# Patient Record
Sex: Female | Born: 1955 | Race: Asian | Hispanic: No | Marital: Married | Smoking: Never smoker
Health system: Southern US, Community
[De-identification: ages and names within clinical notes are randomized; demographics above are authoritative.]

## PROBLEM LIST (undated history)

## (undated) DIAGNOSIS — E7800 Pure hypercholesterolemia, unspecified: Secondary | ICD-10-CM

## (undated) HISTORY — PX: AUGMENTATION MAMMAPLASTY: SUR837

---

## 2002-10-22 ENCOUNTER — Encounter: Payer: Self-pay | Admitting: Family Medicine

## 2002-10-22 ENCOUNTER — Ambulatory Visit (HOSPITAL_COMMUNITY): Admission: RE | Admit: 2002-10-22 | Discharge: 2002-10-22 | Payer: Self-pay | Admitting: Family Medicine

## 2003-09-22 ENCOUNTER — Other Ambulatory Visit: Admission: RE | Admit: 2003-09-22 | Discharge: 2003-09-22 | Payer: Self-pay | Admitting: Family Medicine

## 2003-10-26 ENCOUNTER — Ambulatory Visit (HOSPITAL_COMMUNITY): Admission: RE | Admit: 2003-10-26 | Discharge: 2003-10-26 | Payer: Self-pay | Admitting: Family Medicine

## 2004-12-06 ENCOUNTER — Ambulatory Visit (HOSPITAL_COMMUNITY): Admission: RE | Admit: 2004-12-06 | Discharge: 2004-12-06 | Payer: Self-pay | Admitting: Family Medicine

## 2005-01-03 ENCOUNTER — Other Ambulatory Visit: Admission: RE | Admit: 2005-01-03 | Discharge: 2005-01-03 | Payer: Self-pay | Admitting: Family Medicine

## 2006-02-26 ENCOUNTER — Ambulatory Visit (HOSPITAL_COMMUNITY): Admission: RE | Admit: 2006-02-26 | Discharge: 2006-02-26 | Payer: Self-pay | Admitting: Family Medicine

## 2007-03-07 ENCOUNTER — Ambulatory Visit (HOSPITAL_COMMUNITY): Admission: RE | Admit: 2007-03-07 | Discharge: 2007-03-07 | Payer: Self-pay | Admitting: Internal Medicine

## 2008-03-10 ENCOUNTER — Ambulatory Visit (HOSPITAL_COMMUNITY): Admission: RE | Admit: 2008-03-10 | Discharge: 2008-03-10 | Payer: Self-pay | Admitting: Internal Medicine

## 2009-03-25 ENCOUNTER — Ambulatory Visit (HOSPITAL_COMMUNITY): Admission: RE | Admit: 2009-03-25 | Discharge: 2009-03-25 | Payer: Self-pay | Admitting: Internal Medicine

## 2010-05-05 ENCOUNTER — Ambulatory Visit (HOSPITAL_COMMUNITY): Admission: RE | Admit: 2010-05-05 | Discharge: 2010-05-05 | Payer: Self-pay | Admitting: Internal Medicine

## 2010-09-18 ENCOUNTER — Encounter: Payer: Self-pay | Admitting: Family Medicine

## 2011-07-03 ENCOUNTER — Other Ambulatory Visit (HOSPITAL_BASED_OUTPATIENT_CLINIC_OR_DEPARTMENT_OTHER): Payer: Self-pay | Admitting: Internal Medicine

## 2011-07-03 ENCOUNTER — Other Ambulatory Visit (HOSPITAL_COMMUNITY): Payer: Self-pay | Admitting: Internal Medicine

## 2011-07-03 DIAGNOSIS — Z1231 Encounter for screening mammogram for malignant neoplasm of breast: Secondary | ICD-10-CM

## 2011-07-04 ENCOUNTER — Ambulatory Visit (HOSPITAL_BASED_OUTPATIENT_CLINIC_OR_DEPARTMENT_OTHER): Payer: Self-pay

## 2011-08-01 ENCOUNTER — Ambulatory Visit (HOSPITAL_COMMUNITY): Payer: Self-pay

## 2011-08-07 ENCOUNTER — Ambulatory Visit (HOSPITAL_BASED_OUTPATIENT_CLINIC_OR_DEPARTMENT_OTHER): Payer: Self-pay

## 2011-08-08 ENCOUNTER — Ambulatory Visit (HOSPITAL_BASED_OUTPATIENT_CLINIC_OR_DEPARTMENT_OTHER)
Admission: RE | Admit: 2011-08-08 | Discharge: 2011-08-08 | Disposition: A | Discharge status: Home / Self Care | Payer: 59 | Source: Ambulatory Visit | Attending: Internal Medicine | Admitting: Internal Medicine

## 2011-08-08 DIAGNOSIS — Z1231 Encounter for screening mammogram for malignant neoplasm of breast: Secondary | ICD-10-CM | POA: Insufficient documentation

## 2012-10-21 ENCOUNTER — Ambulatory Visit (HOSPITAL_BASED_OUTPATIENT_CLINIC_OR_DEPARTMENT_OTHER)
Admission: RE | Admit: 2012-10-21 | Discharge: 2012-10-21 | Disposition: A | Discharge status: Home / Self Care | Payer: 59 | Source: Ambulatory Visit | Attending: Internal Medicine | Admitting: Internal Medicine

## 2012-10-21 ENCOUNTER — Other Ambulatory Visit (HOSPITAL_BASED_OUTPATIENT_CLINIC_OR_DEPARTMENT_OTHER): Payer: Self-pay | Admitting: *Deleted

## 2012-10-21 ENCOUNTER — Other Ambulatory Visit (HOSPITAL_BASED_OUTPATIENT_CLINIC_OR_DEPARTMENT_OTHER): Payer: Self-pay | Admitting: Internal Medicine

## 2012-10-21 DIAGNOSIS — Z1239 Encounter for other screening for malignant neoplasm of breast: Secondary | ICD-10-CM

## 2012-10-21 DIAGNOSIS — Z1231 Encounter for screening mammogram for malignant neoplasm of breast: Secondary | ICD-10-CM | POA: Insufficient documentation

## 2013-11-06 ENCOUNTER — Other Ambulatory Visit (HOSPITAL_BASED_OUTPATIENT_CLINIC_OR_DEPARTMENT_OTHER): Payer: Self-pay | Admitting: General Practice

## 2013-11-06 ENCOUNTER — Other Ambulatory Visit (HOSPITAL_BASED_OUTPATIENT_CLINIC_OR_DEPARTMENT_OTHER): Payer: Self-pay | Admitting: Cardiology

## 2013-11-06 ENCOUNTER — Other Ambulatory Visit (HOSPITAL_BASED_OUTPATIENT_CLINIC_OR_DEPARTMENT_OTHER): Payer: Self-pay | Admitting: Internal Medicine

## 2013-11-06 DIAGNOSIS — Z1231 Encounter for screening mammogram for malignant neoplasm of breast: Secondary | ICD-10-CM

## 2013-11-12 ENCOUNTER — Ambulatory Visit (HOSPITAL_BASED_OUTPATIENT_CLINIC_OR_DEPARTMENT_OTHER)
Admission: RE | Admit: 2013-11-12 | Discharge: 2013-11-12 | Disposition: A | Discharge status: Home / Self Care | Payer: Managed Care, Other (non HMO) | Source: Ambulatory Visit | Attending: General Practice | Admitting: General Practice

## 2013-11-12 DIAGNOSIS — Z1231 Encounter for screening mammogram for malignant neoplasm of breast: Secondary | ICD-10-CM

## 2015-09-01 ENCOUNTER — Other Ambulatory Visit: Payer: Self-pay

## 2016-10-04 ENCOUNTER — Other Ambulatory Visit: Payer: Self-pay

## 2017-03-17 ENCOUNTER — Other Ambulatory Visit (INDEPENDENT_AMBULATORY_CARE_PROVIDER_SITE_OTHER): Payer: Self-pay | Admitting: Adult Health

## 2017-03-17 ENCOUNTER — Ambulatory Visit (INDEPENDENT_AMBULATORY_CARE_PROVIDER_SITE_OTHER): Payer: Self-pay

## 2017-03-17 DIAGNOSIS — S99921A Unspecified injury of right foot, initial encounter: Secondary | ICD-10-CM

## 2017-04-24 ENCOUNTER — Ambulatory Visit (HOSPITAL_BASED_OUTPATIENT_CLINIC_OR_DEPARTMENT_OTHER)
Admission: RE | Admit: 2017-04-24 | Discharge: 2017-04-24 | Disposition: A | Discharge status: Home / Self Care | Payer: Managed Care, Other (non HMO) | Source: Ambulatory Visit | Attending: Physician Assistant | Admitting: Physician Assistant

## 2017-04-24 ENCOUNTER — Inpatient Hospital Stay (HOSPITAL_BASED_OUTPATIENT_CLINIC_OR_DEPARTMENT_OTHER)
Admission: EM | Admit: 2017-04-24 | Discharge: 2017-04-28 | DRG: 373 | Disposition: A | Discharge status: Home / Self Care | Payer: Managed Care, Other (non HMO) | Attending: General Surgery | Admitting: General Surgery

## 2017-04-24 ENCOUNTER — Other Ambulatory Visit (HOSPITAL_BASED_OUTPATIENT_CLINIC_OR_DEPARTMENT_OTHER): Payer: Self-pay | Admitting: Physician Assistant

## 2017-04-24 ENCOUNTER — Encounter (HOSPITAL_BASED_OUTPATIENT_CLINIC_OR_DEPARTMENT_OTHER): Payer: Self-pay | Admitting: Radiology

## 2017-04-24 DIAGNOSIS — K3532 Acute appendicitis with perforation and localized peritonitis, without abscess: Secondary | ICD-10-CM | POA: Diagnosis present

## 2017-04-24 DIAGNOSIS — R1031 Right lower quadrant pain: Secondary | ICD-10-CM

## 2017-04-24 DIAGNOSIS — R935 Abnormal findings on diagnostic imaging of other abdominal regions, including retroperitoneum: Secondary | ICD-10-CM | POA: Insufficient documentation

## 2017-04-24 DIAGNOSIS — K37 Unspecified appendicitis: Secondary | ICD-10-CM | POA: Insufficient documentation

## 2017-04-24 DIAGNOSIS — Z7983 Long term (current) use of bisphosphonates: Secondary | ICD-10-CM

## 2017-04-24 DIAGNOSIS — M810 Age-related osteoporosis without current pathological fracture: Secondary | ICD-10-CM | POA: Diagnosis present

## 2017-04-24 DIAGNOSIS — E7800 Pure hypercholesterolemia, unspecified: Secondary | ICD-10-CM | POA: Diagnosis present

## 2017-04-24 DIAGNOSIS — R911 Solitary pulmonary nodule: Secondary | ICD-10-CM | POA: Insufficient documentation

## 2017-04-24 DIAGNOSIS — E785 Hyperlipidemia, unspecified: Secondary | ICD-10-CM | POA: Diagnosis present

## 2017-04-24 DIAGNOSIS — Z882 Allergy status to sulfonamides status: Secondary | ICD-10-CM

## 2017-04-24 DIAGNOSIS — K352 Acute appendicitis with generalized peritonitis: Principal | ICD-10-CM | POA: Diagnosis present

## 2017-04-24 HISTORY — DX: Pure hypercholesterolemia, unspecified: E78.00

## 2017-04-24 LAB — CBC WITH DIFFERENTIAL/PLATELET
Basophils Absolute: 0 10*3/uL (ref 0.0–0.1)
Basophils Relative: 1 %
EOS PCT: 3 %
Eosinophils Absolute: 0.1 10*3/uL (ref 0.0–0.7)
HEMATOCRIT: 39.1 % (ref 36.0–46.0)
HEMOGLOBIN: 13.2 g/dL (ref 12.0–15.0)
LYMPHS ABS: 1.1 10*3/uL (ref 0.7–4.0)
LYMPHS PCT: 28 %
MCH: 30 pg (ref 26.0–34.0)
MCHC: 33.8 g/dL (ref 30.0–36.0)
MCV: 88.9 fL (ref 78.0–100.0)
Monocytes Absolute: 0.4 10*3/uL (ref 0.1–1.0)
Monocytes Relative: 9 %
NEUTROS ABS: 2.4 10*3/uL (ref 1.7–7.7)
Neutrophils Relative %: 59 %
PLATELETS: 163 10*3/uL (ref 150–400)
RBC: 4.4 MIL/uL (ref 3.87–5.11)
RDW: 11.9 % (ref 11.5–15.5)
WBC: 4 10*3/uL (ref 4.0–10.5)

## 2017-04-24 LAB — BASIC METABOLIC PANEL
Anion gap: 9 (ref 5–15)
BUN: 10 mg/dL (ref 6–20)
CHLORIDE: 101 mmol/L (ref 101–111)
CO2: 28 mmol/L (ref 22–32)
Calcium: 10.1 mg/dL (ref 8.9–10.3)
Creatinine, Ser: 0.66 mg/dL (ref 0.44–1.00)
GFR calc Af Amer: >60 mL/min (ref >60)
GFR calc non Af Amer: >60 mL/min (ref >60)
GLUCOSE: 94 mg/dL (ref 65–99)
POTASSIUM: 4 mmol/L (ref 3.5–5.1)
Sodium: 138 mmol/L (ref 135–145)

## 2017-04-24 MED ORDER — DIPHENHYDRAMINE HCL 12.5 MG/5ML PO ELIX: 12.5000 mg | ORAL_SOLUTION | Freq: Four times a day (QID) | ORAL | Status: DC | PRN

## 2017-04-24 MED ORDER — PIPERACILLIN-TAZOBACTAM 3.375 G IVPB 30 MIN
3.3750 g | Freq: Once | INTRAVENOUS | Status: AC
  Administered 2017-04-24: 3.375 g via INTRAVENOUS
  Filled 2017-04-24 (×2): qty 50

## 2017-04-24 MED ORDER — ONDANSETRON HCL 4 MG/2ML IJ SOLN: 4.0000 mg | Freq: Four times a day (QID) | INTRAMUSCULAR | Status: DC | PRN

## 2017-04-24 MED ORDER — MORPHINE SULFATE (PF) 2 MG/ML IV SOLN: 2.0000 mg | INTRAVENOUS | Status: DC | PRN

## 2017-04-24 MED ORDER — HYDROMORPHONE HCL-NACL 0.5-0.9 MG/ML-% IV SOSY: 0.5000 mg | PREFILLED_SYRINGE | INTRAVENOUS | Status: DC | PRN

## 2017-04-24 MED ORDER — IOPAMIDOL (ISOVUE-300) INJECTION 61%
100.0000 mL | Freq: Once | INTRAVENOUS | Status: AC | PRN
Start: 2017-04-24 — End: 2017-04-24
  Administered 2017-04-24: 100 mL via INTRAVENOUS

## 2017-04-24 MED ORDER — ACETAMINOPHEN 650 MG RE SUPP: 650.0000 mg | Freq: Four times a day (QID) | RECTAL | Status: DC | PRN

## 2017-04-24 MED ORDER — LACTATED RINGERS IV SOLN
INTRAVENOUS | Status: AC
  Administered 2017-04-24: 20:00:00 via INTRAVENOUS

## 2017-04-24 MED ORDER — HYDRALAZINE HCL 20 MG/ML IJ SOLN
10.0000 mg | INTRAMUSCULAR | Status: DC | PRN
  Filled 2017-04-24: qty 0.5

## 2017-04-24 MED ORDER — PIPERACILLIN-TAZOBACTAM 3.375 G IVPB
3.3750 g | Freq: Three times a day (TID) | INTRAVENOUS | Status: DC
  Administered 2017-04-25 – 2017-04-28 (×10): 3.375 g via INTRAVENOUS
  Filled 2017-04-24 (×11): qty 50

## 2017-04-24 MED ORDER — ONDANSETRON HCL 4 MG/2ML IJ SOLN
4.0000 mg | Freq: Once | INTRAMUSCULAR | Status: AC
  Administered 2017-04-24: 4 mg via INTRAVENOUS
  Filled 2017-04-24: qty 2

## 2017-04-24 MED ORDER — KCL IN DEXTROSE-NACL 20-5-0.45 MEQ/L-%-% IV SOLN
INTRAVENOUS | Status: DC
  Administered 2017-04-25 – 2017-04-27 (×3): via INTRAVENOUS
  Filled 2017-04-24 (×6): qty 1000

## 2017-04-24 MED ORDER — HYDROMORPHONE HCL 1 MG/ML IJ SOLN: 0.5000 mg | INTRAMUSCULAR | Status: DC | PRN

## 2017-04-24 MED ORDER — ONDANSETRON 4 MG PO TBDP: 4.0000 mg | ORAL_TABLET | Freq: Four times a day (QID) | ORAL | Status: DC | PRN

## 2017-04-24 MED ORDER — SODIUM CHLORIDE 0.9 % IV BOLUS (SEPSIS)
1000.0000 mL | Freq: Once | INTRAVENOUS | Status: AC
  Administered 2017-04-24: 1000 mL via INTRAVENOUS

## 2017-04-24 MED ORDER — ENOXAPARIN SODIUM 40 MG/0.4ML ~~LOC~~ SOLN
40.0000 mg | SUBCUTANEOUS | Status: DC
  Administered 2017-04-25 – 2017-04-27 (×3): 40 mg via SUBCUTANEOUS
  Filled 2017-04-24 (×3): qty 0.4

## 2017-04-24 MED ORDER — DIPHENHYDRAMINE HCL 50 MG/ML IJ SOLN: 12.5000 mg | Freq: Four times a day (QID) | INTRAMUSCULAR | Status: DC | PRN

## 2017-04-24 MED ORDER — IBUPROFEN 200 MG PO TABS: 600.0000 mg | ORAL_TABLET | Freq: Four times a day (QID) | ORAL | Status: DC | PRN

## 2017-04-24 MED ORDER — ONDANSETRON HCL 4 MG/2ML IJ SOLN: 4.0000 mg | Freq: Three times a day (TID) | INTRAMUSCULAR | Status: AC | PRN

## 2017-04-24 MED ORDER — ACETAMINOPHEN 325 MG PO TABS: 650.0000 mg | ORAL_TABLET | Freq: Four times a day (QID) | ORAL | Status: DC | PRN

## 2017-04-24 NOTE — Progress Notes (Signed)
Paged Dr Sheliah Hatch that pt has arrived to unit at this time.

## 2017-04-24 NOTE — ED Triage Notes (Addendum)
Pt advises she had RLQ Friday with vomiting, was seen by her dr and had CT scan done here today, was called back and told she has an appendicitis and to go to the ED. Pt reports mild RLQ pain at this time.

## 2017-04-24 NOTE — ED Provider Notes (Signed)
[facility]   CSN: [id number] Arrival date & time: 04/24/17  1731     History   Chief Complaint Chief Complaint  Patient presents with  . Abdominal Pain    HPI Barbara [name] is a 61 y.o. female who presents to the emergency department today for perforated appendicitis on outpatient CT scan. Patient notes that she was returning from a flight on Friday at 1pm when she started noticing a dull achy generalized abdominal pain. Later that day she developed associated nausea and nonbilious, non-bloody emesis x 5. No emesis since. On Sunday the pain localized to the RLQ and became more severe. Noted associated chills. Did not check temperature at home. She was seen by outpatient doctor on Monday for this. At this time she was running a fever and notes that on exam she had focal tenderness to the RLQ that "nearly made her jump off the table". Patient states that over the last 2 days her pain has decreased and now she is at a 3/10. No fever today. Received CT scan earlier today that showed findings compatible with perforated appendicitis.    HPI  Past Medical History:  Diagnosis Date  . High cholesterol     Patient Active Problem List   Diagnosis Date Noted  . Perforated appendicitis 04/24/2017    Past Surgical History:  Procedure Laterality Date  . CESAREAN SECTION      OB History    No data available       Home Medications    Prior to Admission medications   Medication Sig Start Date End Date Taking? Authorizing Provider  atorvastatin (LIPITOR) 40 MG tablet Take 40 mg by mouth daily.   Yes [provider]  zolpidem (AMBIEN) 5 MG tablet Take 5 mg by mouth at bedtime as needed for sleep.   Yes [provider]    Family History No family history on file.  Social History Social History  Substance Use Topics  . Smoking status: Never Smoker  . Smokeless tobacco: Never Used  . Alcohol use 1.2 oz/week    2 Glasses of wine per  week     Comment: daily     Allergies   Sulfa antibiotics   Review of Systems Review of Systems  All other systems reviewed and are negative.    Physical Exam Updated Vital Signs BP 117/84   Pulse 62   Temp 98.4 F (36.9 C) (Oral)   Resp 17   Ht 5\' 5"  (1.651 m)   Wt 55.8 kg (123 lb)   SpO2 99%   BMI 20.47 kg/m   Physical Exam  Constitutional: She appears well-developed and well-nourished.  Nontoxic appearing  HENT:  Head: Normocephalic and atraumatic.  Right Ear: External ear normal.  Left Ear: External ear normal.  Nose: Nose normal.  Mouth/Throat: Uvula is midline, oropharynx is clear and moist and mucous membranes are normal. No tonsillar exudate.  Eyes: Pupils are equal, round, and reactive to light. Conjunctivae and EOM are normal. Right eye exhibits no discharge. Left eye exhibits no discharge. No scleral icterus.  Neck: Trachea normal and normal range of motion. Neck supple. No spinous process tenderness present. No neck rigidity. Normal range of motion present.  Cardiovascular: Normal rate, regular rhythm and intact distal pulses.   No murmur heard. Pulses:      Radial pulses are 2+ on the right side, and 2+ on the left side.       Dorsalis pedis pulses are 2+  on the right side, and 2+ on the left side.       Posterior tibial pulses are 2+ on the right side, and 2+ on the left side.  No lower extremity swelling or edema. Calves symmetric in size bilaterally.  Pulmonary/Chest: Effort normal and breath sounds normal. She exhibits no tenderness.  Abdominal: Soft. Normal appearance and bowel sounds are normal. There is tenderness in the right lower quadrant. There is tenderness at McBurney's point. There is no rigidity, no guarding and no CVA tenderness.  Negative Rovsing's and Obturator signs. Positive Psoas sign.   Musculoskeletal: She exhibits no edema.  Lymphadenopathy:    She has no cervical adenopathy.  Neurological: She is alert.  Skin: Skin is warm,  dry and intact. No rash noted. She is not diaphoretic.  Psychiatric: She has a normal mood and affect.  Nursing note and vitals reviewed.    ED Treatments / Results  Labs (all labs ordered are listed, but only abnormal results are displayed) Labs Reviewed  CULTURE, BLOOD (ROUTINE X 2)  CULTURE, BLOOD (ROUTINE X 2)  CBC WITH DIFFERENTIAL/PLATELET  BASIC METABOLIC PANEL    EKG  EKG Interpretation None       Radiology Ct Abdomen Pelvis W Contrast  Result Date: 04/24/2017 CLINICAL DATA:  62 year old with right lower quadrant pain. Onset 4 days ago. EXAM: CT ABDOMEN AND PELVIS WITH CONTRAST TECHNIQUE: Multidetector CT imaging of the abdomen and pelvis was performed using the standard protocol following bolus administration of intravenous contrast. CONTRAST:  ISOVUE-300 IOPAMIDOL (ISOVUE-300) INJECTION 61% COMPARISON:  None. FINDINGS: Lower chest: 4 mm nodule at the left lung base, best seen on sequence 5, image 44. No pleural effusions. Hepatobiliary: Normal appearance of the liver, gallbladder and portal venous system. No biliary dilatation. Pancreas: Normal appearance of the pancreas without inflammation or duct dilatation. Spleen: Normal appearance of spleen without enlargement. Adrenals/Urinary Tract: Adrenal glands are normal. Normal appearance of the left kidney without hydronephrosis. Urinary bladder is unremarkable. 0.7 cm cyst in the right kidney midpole region. No evidence for right hydronephrosis. Stomach/Bowel: The appendix is abnormal with extensive inflammation. Findings are compatible with acute appendicitis. Small amount of fluid in the right lower quadrant. No discrete abscess collection. The appendix is in a retrocecal location. Oral contrast in the small and large bowel. No evidence for bowel obstruction. Inflammatory changes in the ileocolic mesentery secondary to the appendix inflammation. Normal appearance of the stomach and duodenum. Vascular/Lymphatic: Abdominal  aorta is tortuous without aneurysm. Minimal atherosclerotic disease in the aorta and iliac arteries. No significant lymph node enlargement in the abdomen or pelvis. Reproductive: 2.5 cm low-density structure in the right hemipelvis could represent a small ovarian cyst. Otherwise, no gross abnormality to the uterus or adnexal structures. Other: Inflammatory changes in the right lower quadrant with a small amount of fluid. No discrete abscess collection. No evidence for free air. Musculoskeletal: No acute abnormality. IMPRESSION: Abnormal appendix with a large amount of surrounding inflammation. Findings are compatible with a perforated appendicitis. No discrete abscess collection. 2.5 cm low-density structure in the right adnexa region. This could represent a small adnexal or ovarian cyst. In a postmenopausal female, recommend a nonemergent pelvic ultrasound for better characterization and follow-up. 0.4 cm nodule at the left lung base. No follow-up needed if patient is low-risk. Non-contrast chest CT can be considered in 12 months if patient is high-risk. This recommendation follows the consensus statement: Guidelines for Management of Incidental Pulmonary Nodules Detected on CT Images: From the Fleischner Society  2017; Radiology 2017; 161:096-045. These results were called by telephone at the time of interpretation on 04/24/2017 at 4:10 pm to [facility] , who verbally acknowledged these results. Electronically Signed   By: Richarda Overlie M.D.   On: 04/24/2017 16:11    Procedures Procedures (including critical care time)  Medications Ordered in ED Medications  lactated ringers infusion ( Intravenous Transfusing/Transfer 04/24/17 2032)  HYDROmorphone (DILAUDID) injection 0.5 mg (not administered)  ondansetron (ZOFRAN) injection 4 mg (not administered)  ondansetron (ZOFRAN) injection 4 mg (4 mg Intravenous Given 04/24/17 1852)  sodium chloride 0.9 % bolus 1,000 mL (0 mLs Intravenous Stopped 04/24/17 1923)    piperacillin-tazobactam (ZOSYN) IVPB 3.375 g (0 g Intravenous Stopped 04/24/17 1922)     Initial Impression / Assessment and Plan / ED Course  I have reviewed the triage vital signs and the nursing notes.  Pertinent labs & imaging results that were available during my care of the patient were reviewed by me and considered in my medical decision making (see chart for details).     62 year old female with a perforated appendicitis. Symptoms began Friday morning as generalized abdominal pain and localized to right lower quadrant over several days. Associated symptoms included nausea, vomiting, fever, chills. Patient was worked up at a outpatient clinic and received a outpatient CT scan today which showed perforated appendicitis. Upon presentation the patient was nonseptic appearing. Vital signs without fever, tachycardia, hypertension, tachypnea, or hypoxia. Patient was noted to have tenderness of right lower quadrant on exam on exam. Positive McBurney's and psoas sign. Labs and blood cultures drawn. Patient started on IV fluid and IV Zosyn. CBC without leukocytosis and is otherwise unremarkable. BMP unremarkable. Consult placed to general surgery. Dr. Sheliah Hatch returned consult and asked to transfer patient to [facility] and admit under his name for further care. Discussed this with patient who is in agreement with plan. All questions answered. The patient is hemodynamically stable and appears safe at time of transfer.   Final Clinical Impressions(s) / ED Diagnoses   Final diagnoses:  Perforated appendicitis    New Prescriptions New Prescriptions   No medications on file     Princella Pellegrini 04/24/17 2045    Melene Plan, DO 04/24/17 2049

## 2017-04-25 LAB — BASIC METABOLIC PANEL
Anion gap: 7 (ref 5–15)
BUN: 8 mg/dL (ref 6–20)
CALCIUM: 9 mg/dL (ref 8.9–10.3)
CO2: 27 mmol/L (ref 22–32)
CREATININE: 0.67 mg/dL (ref 0.44–1.00)
Chloride: 105 mmol/L (ref 101–111)
GFR calc Af Amer: >60 mL/min (ref >60)
GFR calc non Af Amer: >60 mL/min (ref >60)
GLUCOSE: 106 mg/dL — AB (ref 65–99)
Potassium: 3.8 mmol/L (ref 3.5–5.1)
Sodium: 139 mmol/L (ref 135–145)

## 2017-04-25 LAB — CBC
HCT: 35.9 % — ABNORMAL LOW (ref 36.0–46.0)
HEMOGLOBIN: 12.3 g/dL (ref 12.0–15.0)
MCH: 29.9 pg (ref 26.0–34.0)
MCHC: 34.3 g/dL (ref 30.0–36.0)
MCV: 87.3 fL (ref 78.0–100.0)
PLATELETS: 150 10*3/uL (ref 150–400)
RBC: 4.11 MIL/uL (ref 3.87–5.11)
RDW: 12.1 % (ref 11.5–15.5)
WBC: 3.4 10*3/uL — ABNORMAL LOW (ref 4.0–10.5)

## 2017-04-25 LAB — HIV ANTIBODY (ROUTINE TESTING W REFLEX): HIV Screen 4th Generation wRfx: NONREACTIVE

## 2017-04-25 MED ORDER — LORAZEPAM 2 MG/ML IJ SOLN
0.2500 mg | INTRAMUSCULAR | Status: DC | PRN
  Administered 2017-04-25: 0.25 mg via INTRAVENOUS
  Filled 2017-04-25: qty 1

## 2017-04-25 NOTE — H&P (Signed)
Barbara [name] is an 61 y.o. female.   Chief Complaint: abdominal pain HPI: 61 yo female with 4 days of abdominal pain. It started and then the next day was most severe, since then it has become more in the right lower quadrant but decreased in severity. She went to her PCP and took a CT scan showing a perforated appendix. She denies nausea or vomiting or diarrhea. She has never had a pain like this before.  Past Medical History:  Diagnosis Date  . High cholesterol     Past Surgical History:  Procedure Laterality Date  . CESAREAN SECTION      History reviewed. No pertinent family history. Social History:  reports that she has never smoked. She has never used smokeless tobacco. She reports that she drinks about 1.2 oz of alcohol per week . She reports that she does not use drugs.  Allergies:  Allergies  Allergen Reactions  . Sulfa Antibiotics Hives    Medications Prior to Admission  Medication Sig Dispense Refill  . atorvastatin (LIPITOR) 40 MG tablet Take 40 mg by mouth daily.    Marland Kitchen b complex vitamins tablet Take 1 tablet by mouth daily.    . calcium carbonate (OS-CAL - DOSED IN MG OF ELEMENTAL CALCIUM) 1250 (500 Ca) MG tablet Take 1 tablet by mouth daily with breakfast.    . cholecalciferol (VITAMIN D) 1000 units tablet Take 2,000 Units by mouth daily.    . magnesium oxide (MAG-OX) 400 MG tablet Take 400 mg by mouth daily.    . Omega-3 Fatty Acids (FISH OIL) 1000 MG CAPS Take 2,000 mg by mouth daily.    Marland Kitchen zolpidem (AMBIEN) 5 MG tablet Take 5 mg by mouth at bedtime as needed for sleep.      Results for orders placed or performed during the hospital encounter of 04/24/17 (from the past 48 hour(s))  CBC with Differential     Status: None   Collection Time: 04/24/17  6:27 PM  Result Value Ref Range   WBC 4.0 4.0 - 10.5 K/uL   RBC 4.40 3.87 - 5.11 MIL/uL   Hemoglobin 13.2 12.0 - 15.0 g/dL   HCT 39.1 36.0 - 46.0 %   MCV 88.9 78.0 - 100.0 fL   MCH 30.0 26.0 - 34.0 pg   MCHC  33.8 30.0 - 36.0 g/dL   RDW 11.9 11.5 - 15.5 %   Platelets 163 150 - 400 K/uL   Neutrophils Relative % 59 %   Neutro Abs 2.4 1.7 - 7.7 K/uL   Lymphocytes Relative 28 %   Lymphs Abs 1.1 0.7 - 4.0 K/uL   Monocytes Relative 9 %   Monocytes Absolute 0.4 0.1 - 1.0 K/uL   Eosinophils Relative 3 %   Eosinophils Absolute 0.1 0.0 - 0.7 K/uL   Basophils Relative 1 %   Basophils Absolute 0.0 0.0 - 0.1 K/uL  Basic metabolic panel     Status: None   Collection Time: 04/24/17  6:27 PM  Result Value Ref Range   Sodium 138 135 - 145 mmol/L   Potassium 4.0 3.5 - 5.1 mmol/L   Chloride 101 101 - 111 mmol/L   CO2 28 22 - 32 mmol/L   Glucose, Bld 94 65 - 99 mg/dL   BUN 10 6 - 20 mg/dL   Creatinine, Ser 0.66 0.44 - 1.00 mg/dL   Calcium 10.1 8.9 - 10.3 mg/dL   GFR calc non Af Amer >60 >60 mL/min   GFR calc Af Amer >60 >  60 mL/min    Comment: (NOTE) The eGFR has been calculated using the CKD EPI equation. This calculation has not been validated in all clinical situations. eGFR's persistently <60 mL/min signify possible Chronic Kidney Disease.    Anion gap 9 5 - 15  Basic metabolic panel     Status: Abnormal   Collection Time: 04/25/17  4:53 AM  Result Value Ref Range   Sodium 139 135 - 145 mmol/L   Potassium 3.8 3.5 - 5.1 mmol/L   Chloride 105 101 - 111 mmol/L   CO2 27 22 - 32 mmol/L   Glucose, Bld 106 (H) 65 - 99 mg/dL   BUN 8 6 - 20 mg/dL   Creatinine, Ser 0.67 0.44 - 1.00 mg/dL   Calcium 9.0 8.9 - 10.3 mg/dL   GFR calc non Af Amer >60 >60 mL/min   GFR calc Af Amer >60 >60 mL/min    Comment: (NOTE) The eGFR has been calculated using the CKD EPI equation. This calculation has not been validated in all clinical situations. eGFR's persistently <60 mL/min signify possible Chronic Kidney Disease.    Anion gap 7 5 - 15  CBC     Status: Abnormal   Collection Time: 04/25/17  4:53 AM  Result Value Ref Range   WBC 3.4 (L) 4.0 - 10.5 K/uL   RBC 4.11 3.87 - 5.11 MIL/uL   Hemoglobin 12.3 12.0 -  15.0 g/dL   HCT 35.9 (L) 36.0 - 46.0 %   MCV 87.3 78.0 - 100.0 fL   MCH 29.9 26.0 - 34.0 pg   MCHC 34.3 30.0 - 36.0 g/dL   RDW 12.1 11.5 - 15.5 %   Platelets 150 150 - 400 K/uL   Ct Abdomen Pelvis W Contrast  Result Date: 04/24/2017 CLINICAL DATA:  61 year old with right lower quadrant pain. Onset 4 days ago. EXAM: CT ABDOMEN AND PELVIS WITH CONTRAST TECHNIQUE: Multidetector CT imaging of the abdomen and pelvis was performed using the standard protocol following bolus administration of intravenous contrast. CONTRAST:  129m ISOVUE-300 IOPAMIDOL (ISOVUE-300) INJECTION 61% COMPARISON:  None. FINDINGS: Lower chest: 4 mm nodule at the left lung base, best seen on sequence 5, image 44. No pleural effusions. Hepatobiliary: Normal appearance of the liver, gallbladder and portal venous system. No biliary dilatation. Pancreas: Normal appearance of the pancreas without inflammation or duct dilatation. Spleen: Normal appearance of spleen without enlargement. Adrenals/Urinary Tract: Adrenal glands are normal. Normal appearance of the left kidney without hydronephrosis. Urinary bladder is unremarkable. 0.7 cm cyst in the right kidney midpole region. No evidence for right hydronephrosis. Stomach/Bowel: The appendix is abnormal with extensive inflammation. Findings are compatible with acute appendicitis. Small amount of fluid in the right lower quadrant. No discrete abscess collection. The appendix is in a retrocecal location. Oral contrast in the small and large bowel. No evidence for bowel obstruction. Inflammatory changes in the ileocolic mesentery secondary to the appendix inflammation. Normal appearance of the stomach and duodenum. Vascular/Lymphatic: Abdominal aorta is tortuous without aneurysm. Minimal atherosclerotic disease in the aorta and iliac arteries. No significant lymph node enlargement in the abdomen or pelvis. Reproductive: 2.5 cm low-density structure in the right hemipelvis could represent a small  ovarian cyst. Otherwise, no gross abnormality to the uterus or adnexal structures. Other: Inflammatory changes in the right lower quadrant with a small amount of fluid. No discrete abscess collection. No evidence for free air. Musculoskeletal: No acute abnormality. IMPRESSION: Abnormal appendix with a large amount of surrounding inflammation. Findings are compatible with a  perforated appendicitis. No discrete abscess collection. 2.5 cm low-density structure in the right adnexa region. This could represent a small adnexal or ovarian cyst. In a postmenopausal female, recommend a nonemergent pelvic ultrasound for better characterization and follow-up. 0.4 cm nodule at the left lung base. No follow-up needed if patient is low-risk. Non-contrast chest CT can be considered in 12 months if patient is high-risk. This recommendation follows the consensus statement: Guidelines for Management of Incidental Pulmonary Nodules Detected on CT Images: From the Fleischner Society 2017; Radiology 2017; 284:228-243. These results were called by telephone at the time of interpretation on 04/24/2017 at 4:10 pm to [facility] , who verbally acknowledged these results. Electronically Signed   By: Markus Daft M.D.   On: 04/24/2017 16:11    Review of Systems  Constitutional: Negative for chills and fever.  HENT: Negative for hearing loss.   Eyes: Negative for blurred vision and double vision.  Respiratory: Negative for cough and hemoptysis.   Cardiovascular: Negative for chest pain and palpitations.  Gastrointestinal: Negative for abdominal pain, nausea and vomiting.  Genitourinary: Negative for dysuria and urgency.  Musculoskeletal: Negative for myalgias and neck pain.  Skin: Negative for itching and rash.  Neurological: Negative for dizziness, tingling and headaches.  Endo/Heme/Allergies: Does not bruise/bleed easily.  Psychiatric/Behavioral: Negative for depression and suicidal ideas.    Blood pressure 107/75, pulse  (!) 50, temperature 98.1 F (36.7 C), temperature source Oral, resp. rate 16, height _0  (1.651 m), weight 55.8 kg (123 lb), SpO2 98 %. Physical Exam  Vitals reviewed. Constitutional: She is oriented to person, place, and time. She appears well-developed and well-nourished.  HENT:  Head: Normocephalic and atraumatic.  Eyes: Pupils are equal, round, and reactive to light. Conjunctivae and EOM are normal.  Neck: Normal range of motion. Neck supple.  Cardiovascular: Normal rate and regular rhythm.   Respiratory: Effort normal and breath sounds normal.  GI: Soft. Bowel sounds are normal. She exhibits no distension. There is tenderness in the right lower quadrant. There is no rigidity and no guarding.  Musculoskeletal: Normal range of motion.  Neurological: She is alert and oriented to person, place, and time.  Skin: Skin is warm and dry.  Psychiatric: She has a normal mood and affect. Her behavior is normal.     Assessment/Plan 61 yo female with normal white blood cell count and minimal tenderness and CT scan concerning for perforated appendicitis -IV abx -observation -We briefly discussed why having the antibiotics are important and that we would look for further improvement and the transition to oral antibiotics. We discussed likelihood of recurrent appendicitis and why operating on perforated appendicitis may not be the best option for her.  Mickeal Skinner, MD 04/25/2017, 6:21 AM

## 2017-04-25 NOTE — Progress Notes (Signed)
Patient ID: Barbara [name], female   DOB: [birth year], 61 y.o.   MRN: [id number]  [facility] Surgery Progress Note     Subjective: CC- perforated appendicitis Patient admitted to [facility] over night. States that she started having significant lower abdominal pain 8/24. The pain worsened the next day and was associated with chills, but since that time it has somewhat improved. Pain now localized to RLQ. Denies n/v/d.   Objective: Vital signs in last 24 hours: Temp:  [98 F (36.7 C)-98.4 F (36.9 C)] 98.1 F (36.7 C) (08/29 0418) Pulse Rate:  [50-71] 50 (08/29 0418) Resp:  [16-18] 16 (08/29 0418) BP: (107-142)/(75-94) 107/75 (08/29 0418) SpO2:  [97 %-99 %] 98 % (08/29 0418) Weight:  [123 lb (55.8 kg)] 123 lb (55.8 kg) (08/28 1743) Last BM Date: 04/23/17  Intake/Output from previous day: 08/28 0701 - 08/29 0700 In: 1000 [IV Piggyback:1000] Out: 700 [Urine:700] Intake/Output this shift: No intake/output data recorded.  PE: Gen:  Alert, NAD, pleasant HEENT: EOM's intact, pupils equal and round Card:  RRR, no M/G/R heard Pulm:  CTAB, no W/R/R, effort normal Abd: well healed low transverse scar, soft, ND, +BS, no HSM, no hernia, mild RLQ tenderness with no rebound or guarding Ext:  No erythema, edema, or tenderness BUE/BLE  Psych: A&Ox3  Skin: no rashes noted, warm and dry  Lab Results:   Recent Labs  04/24/17 1827 04/25/17 0453  WBC 4.0 3.4*  HGB 13.2 12.3  HCT 39.1 35.9*  PLT 163 150   BMET  Recent Labs  04/24/17 1827 04/25/17 0453  NA 138 139  K 4.0 3.8  CL 101 105  CO2 28 27  GLUCOSE 94 106*  BUN 10 8  CREATININE 0.66 0.67  CALCIUM 10.1 9.0   PT/INR No results for input(s): LABPROT, INR in the last 72 hours. CMP     Component Value Date/Time   NA 139 04/25/2017 0453   K 3.8 04/25/2017 0453   CL 105 04/25/2017 0453   CO2 27 04/25/2017 0453   GLUCOSE 106 (H) 04/25/2017 0453   BUN 8 04/25/2017 0453   CREATININE 0.67 04/25/2017 0453   CALCIUM  9.0 04/25/2017 0453   GFRNONAA >60 04/25/2017 0453   GFRAA >60 04/25/2017 0453   Lipase  No results found for: LIPASE     Studies/Results: Ct Abdomen Pelvis W Contrast  Result Date: 04/24/2017 CLINICAL DATA:  61 year old with right lower quadrant pain. Onset 4 days ago. EXAM: CT ABDOMEN AND PELVIS WITH CONTRAST TECHNIQUE: Multidetector CT imaging of the abdomen and pelvis was performed using the standard protocol following bolus administration of intravenous contrast. CONTRAST:  ISOVUE-300 IOPAMIDOL (ISOVUE-300) INJECTION 61% COMPARISON:  None. FINDINGS: Lower chest: 4 mm nodule at the left lung base, best seen on sequence 5, image 44. No pleural effusions. Hepatobiliary: Normal appearance of the liver, gallbladder and portal venous system. No biliary dilatation. Pancreas: Normal appearance of the pancreas without inflammation or duct dilatation. Spleen: Normal appearance of spleen without enlargement. Adrenals/Urinary Tract: Adrenal glands are normal. Normal appearance of the left kidney without hydronephrosis. Urinary bladder is unremarkable. 0.7 cm cyst in the right kidney midpole region. No evidence for right hydronephrosis. Stomach/Bowel: The appendix is abnormal with extensive inflammation. Findings are compatible with acute appendicitis. Small amount of fluid in the right lower quadrant. No discrete abscess collection. The appendix is in a retrocecal location. Oral contrast in the small and large bowel. No evidence for bowel obstruction. Inflammatory changes in the ileocolic mesentery secondary to  the appendix inflammation. Normal appearance of the stomach and duodenum. Vascular/Lymphatic: Abdominal aorta is tortuous without aneurysm. Minimal atherosclerotic disease in the aorta and iliac arteries. No significant lymph node enlargement in the abdomen or pelvis. Reproductive: 2.5 cm low-density structure in the right hemipelvis could represent a small ovarian cyst. Otherwise, no gross  abnormality to the uterus or adnexal structures. Other: Inflammatory changes in the right lower quadrant with a small amount of fluid. No discrete abscess collection. No evidence for free air. Musculoskeletal: No acute abnormality. IMPRESSION: Abnormal appendix with a large amount of surrounding inflammation. Findings are compatible with a perforated appendicitis. No discrete abscess collection. 2.5 cm low-density structure in the right adnexa region. This could represent a small adnexal or ovarian cyst. In a postmenopausal female, recommend a nonemergent pelvic ultrasound for better characterization and follow-up. 0.4 cm nodule at the left lung base. No follow-up needed if patient is low-risk. Non-contrast chest CT can be considered in 12 months if patient is high-risk. This recommendation follows the consensus statement: Guidelines for Management of Incidental Pulmonary Nodules Detected on CT Images: From the Fleischner Society 2017; Radiology 2017; 284:228-243. These results were called by telephone at the time of interpretation on 04/24/2017 at 4:10 pm to [facility] , who verbally acknowledged these results. Electronically Signed   By: Richarda OverlieAdam  Henn M.D.   On: 04/24/2017 16:11    Anti-infectives: Anti-infectives    Start     Dose/Rate Route Frequency Ordered Stop   04/25/17 0600  piperacillin-tazobactam (ZOSYN) IVPB 3.375 g     3.375 g 12.5 mL/hr over 240 Minutes Intravenous Every 8 hours 04/24/17 2155     04/24/17 1800  piperacillin-tazobactam (ZOSYN) IVPB 3.375 g     3.375 g 100 mL/hr over 30 Minutes Intravenous  Once 04/24/17 1751 04/24/17 1922       Assessment/Plan Osteoporosis - on boniva at home HLD - takes lipitor at home Right hemipelvis structure - 2.5cm, possible small adnexal or ovarian cyst; recommend follow-up with gynecologist   Perforated appendicitis - abdominal surgical history: c section x2 - abdominal pain began 8/24 - CT scan shows abnormal appendix with a large amount  of surrounding inflammation, no discrete abscess - no leukocytosis, afebrile  ID - zosyn 8/28>>day#2 FEN - IVF, NPO VTE - SCDs, lovenox Foley - none Follow up - TBD  Plan - Continue NPO and IV antibiotics. Will continue to monitor.    LOS: 1 day    [name] , [facility] 04/25/2017, 8:03 AM Pager: [phone] Consults: [phone] Mon-Fri 7:00 am-4:30 pm Sat-Sun 7:00 am-11:30 am

## 2017-04-25 NOTE — Progress Notes (Signed)
Paged Dr Luisa Hartornett about patient request for something to help her sleep. Received verbal orders for ativan 0.25mg  IV every 4 hours as needed.

## 2017-04-26 LAB — CBC
HCT: 37.7 % (ref 36.0–46.0)
Hemoglobin: 12.7 g/dL (ref 12.0–15.0)
MCH: 30 pg (ref 26.0–34.0)
MCHC: 33.7 g/dL (ref 30.0–36.0)
MCV: 88.9 fL (ref 78.0–100.0)
PLATELETS: 146 10*3/uL — AB (ref 150–400)
RBC: 4.24 MIL/uL (ref 3.87–5.11)
RDW: 12.3 % (ref 11.5–15.5)
WBC: 3 10*3/uL — AB (ref 4.0–10.5)

## 2017-04-26 MED ORDER — PANTOPRAZOLE SODIUM 40 MG PO TBEC
40.0000 mg | DELAYED_RELEASE_TABLET | Freq: Every day | ORAL | Status: DC
  Administered 2017-04-26 – 2017-04-28 (×3): 40 mg via ORAL
  Filled 2017-04-26 (×3): qty 1

## 2017-04-26 NOTE — Progress Notes (Signed)
[facility] Progress Note     Subjective: CC: perforated appendicitis Patient with mild RLQ pain only with palpation. Reports occasional heartburn. Denies nausea, vomiting. Passing flatus, no BM.   Objective: Vital signs in last 24 hours: Temp:  [98.2 F (36.8 C)-98.3 F (36.8 C)] 98.2 F (36.8 C) (08/30 0521) Pulse Rate:  [50-66] 50 (08/30 0521) Resp:  [15-16] 15 (08/30 0521) BP: (106-122)/(64-75) 106/64 (08/30 0521) SpO2:  [97 %-99 %] 97 % (08/30 0521) Last BM Date: 04/23/17  Intake/Output from previous day: 08/29 0701 - 08/30 0700 In: 1811 [I.V.:1611; IV Piggyback:200] Out: 1400 [Urine:1400] Intake/Output this shift: No intake/output data recorded.  PE: Gen:  Alert, NAD, pleasant Card:  Regular rate and rhythm, pedal pulses 2+ BL Pulm:  Normal effort, clear to auscultation bilaterally Abd: Soft, non-tender, non-distended, bowel sounds present in all 4 quadrants, no HSM Skin: warm and dry, no rashes  Psych: A&Ox3    Lab Results:   Recent Labs  04/25/17 0453 04/26/17 0500  WBC 3.4* 3.0*  HGB 12.3 12.7  HCT 35.9* 37.7  PLT 150 146*   BMET  Recent Labs  04/24/17 1827 04/25/17 0453  NA 138 139  K 4.0 3.8  CL 101 105  CO2 28 27  GLUCOSE 94 106*  BUN 10 8  CREATININE 0.66 0.67  CALCIUM 10.1 9.0   PT/INR No results for input(s): LABPROT, INR in the last 72 hours. CMP     Component Value Date/Time   NA 139 04/25/2017 0453   K 3.8 04/25/2017 0453   CL 105 04/25/2017 0453   CO2 27 04/25/2017 0453   GLUCOSE 106 (H) 04/25/2017 0453   BUN 8 04/25/2017 0453   CREATININE 0.67 04/25/2017 0453   CALCIUM 9.0 04/25/2017 0453   GFRNONAA >60 04/25/2017 0453   GFRAA >60 04/25/2017 0453   Lipase  No results found for: LIPASE     Studies/Results: Ct Abdomen Pelvis W Contrast  Result Date: 04/24/2017 CLINICAL DATA:  61 year old with right lower quadrant pain. Onset 4 days ago. EXAM: CT ABDOMEN AND PELVIS WITH CONTRAST TECHNIQUE:  Multidetector CT imaging of the abdomen and pelvis was performed using the standard protocol following bolus administration of intravenous contrast. CONTRAST:  ISOVUE-300 IOPAMIDOL (ISOVUE-300) INJECTION 61% COMPARISON:  None. FINDINGS: Lower chest: 4 mm nodule at the left lung base, best seen on sequence 5, image 44. No pleural effusions. Hepatobiliary: Normal appearance of the liver, gallbladder and portal venous system. No biliary dilatation. Pancreas: Normal appearance of the pancreas without inflammation or duct dilatation. Spleen: Normal appearance of spleen without enlargement. Adrenals/Urinary Tract: Adrenal glands are normal. Normal appearance of the left kidney without hydronephrosis. Urinary bladder is unremarkable. 0.7 cm cyst in the right kidney midpole region. No evidence for right hydronephrosis. Stomach/Bowel: The appendix is abnormal with extensive inflammation. Findings are compatible with acute appendicitis. Small amount of fluid in the right lower quadrant. No discrete abscess collection. The appendix is in a retrocecal location. Oral contrast in the small and large bowel. No evidence for bowel obstruction. Inflammatory changes in the ileocolic mesentery secondary to the appendix inflammation. Normal appearance of the stomach and duodenum. Vascular/Lymphatic: Abdominal aorta is tortuous without aneurysm. Minimal atherosclerotic disease in the aorta and iliac arteries. No significant lymph node enlargement in the abdomen or pelvis. Reproductive: 2.5 cm low-density structure in the right hemipelvis could represent a small ovarian cyst. Otherwise, no gross abnormality to the uterus or adnexal structures. Other: Inflammatory changes in the right lower quadrant with  a small amount of fluid. No discrete abscess collection. No evidence for free air. Musculoskeletal: No acute abnormality. IMPRESSION: Abnormal appendix with a large amount of surrounding inflammation. Findings are compatible with a  perforated appendicitis. No discrete abscess collection. 2.5 cm low-density structure in the right adnexa region. This could represent a small adnexal or ovarian cyst. In a postmenopausal female, recommend a nonemergent pelvic ultrasound for better characterization and follow-up. 0.4 cm nodule at the left lung base. No follow-up needed if patient is low-risk. Non-contrast chest CT can be considered in 12 months if patient is high-risk. This recommendation follows the consensus statement: Guidelines for Management of Incidental Pulmonary Nodules Detected on CT Images: From the Fleischner Society 2017; Radiology 2017; 284:228-243. These results were called by telephone at the time of interpretation on 04/24/2017 at 4:10 pm to [facility] , who verbally acknowledged these results. Electronically Signed   By: Richarda OverlieAdam  Henn M.D.   On: 04/24/2017 16:11    Anti-infectives: Anti-infectives    Start     Dose/Rate Route Frequency Ordered Stop   04/25/17 0600  piperacillin-tazobactam (ZOSYN) IVPB 3.375 g     3.375 g 12.5 mL/hr over 240 Minutes Intravenous Every 8 hours 04/24/17 2155     04/24/17 1800  piperacillin-tazobactam (ZOSYN) IVPB 3.375 g     3.375 g 100 mL/hr over 30 Minutes Intravenous  Once 04/24/17 1751 04/24/17 1922       Assessment/Plan Osteoporosis - on boniva at home HLD - takes lipitor at home Right hemipelvis structure - 2.5cm, possible small adnexal or ovarian cyst; recommend follow-up with gynecologist   Perforated appendicitis - abdominal surgical history: c section x2 - abdominal pain began 8/24 - CT scan shows abnormal appendix with a large amount of surrounding inflammation, no discrete abscess - no leukocytosis, afebrile  ID - zosyn 8/28>>day#3 FEN - IVF, clear liquids; protonix for heartburn/indigestion VTE - SCDs, lovenox Foley - none Follow up - TBD  Plan - Continue IV antibiotics. Advance diet to clear liquids. Continue to monitor  LOS: 2 days    Wells GuilesKelly Rayburn  , [facility] 04/26/2017, 7:56 AM Pager: [phone] Consults: [phone] Mon-Fri 7:00 am-4:30 pm Sat-Sun 7:00 am-11:30 am

## 2017-04-27 MED ORDER — MORPHINE SULFATE (PF) 2 MG/ML IV SOLN: 2.0000 mg | INTRAVENOUS | Status: DC | PRN

## 2017-04-27 MED ORDER — OXYCODONE HCL 5 MG PO TABS: 5.0000 mg | ORAL_TABLET | Freq: Four times a day (QID) | ORAL | Status: DC | PRN

## 2017-04-27 NOTE — Progress Notes (Signed)
Patient ID: Barbara [name], female   DOB: 11-16-1955, 61 y.o.   MRN: [id number]  [facility]     Subjective: CC- perforated appendicitis No complaints this morning. Denies any current abdominal pain. Tolerating clear liquids. Denies n/v.  Objective: Vital signs in last 24 hours: Temp:  [97.8 F (36.6 C)-98.3 F (36.8 C)] 98.3 F (36.8 C) (08/31 0500) Pulse Rate:  [44-48] 44 (08/31 0500) Resp:  [15-16] 15 (08/31 0500) BP: (110-123)/(72-78) 110/72 (08/31 0500) SpO2:  [97 %-99 %] 97 % (08/31 0500) Last BM Date: 04/23/17  Intake/Output from previous day: 08/30 0701 - 08/31 0700 In: 1470 [I.V.:1320; IV Piggyback:150] Out: 2950 [Urine:2950] Intake/Output this shift: No intake/output data recorded.  PE: Gen:  Alert, NAD, pleasant HEENT: EOM's intact, pupils equal and round Card:  RRR, no M/G/R heard Pulm:  CTAB, no W/R/R, effort normal Abd: well healed low transverse scar, soft, ND, +BS, no HSM, no hernia, nontender to light and deep palpation Ext:  No erythema, edema, or tenderness BUE/BLE  Psych: A&Ox3  Skin: no rashes noted, warm and dry  Lab Results:   Recent Labs  04/25/17 0453 04/26/17 0500  WBC 3.4* 3.0*  HGB 12.3 12.7  HCT 35.9* 37.7  PLT 150 146*   BMET  Recent Labs  04/24/17 1827 04/25/17 0453  NA 138 139  K 4.0 3.8  CL 101 105  CO2 28 27  GLUCOSE 94 106*  BUN 10 8  CREATININE 0.66 0.67  CALCIUM 10.1 9.0   PT/INR No results for input(s): LABPROT, INR in the last 72 hours. CMP     Component Value Date/Time   NA 139 04/25/2017 0453   K 3.8 04/25/2017 0453   CL 105 04/25/2017 0453   CO2 27 04/25/2017 0453   GLUCOSE 106 (H) 04/25/2017 0453   BUN 8 04/25/2017 0453   CREATININE 0.67 04/25/2017 0453   CALCIUM 9.0 04/25/2017 0453   GFRNONAA >60 04/25/2017 0453   GFRAA >60 04/25/2017 0453   Lipase  No results found for: LIPASE     Studies/Results: No results found.  Anti-infectives: Anti-infectives    Start     Dose/Rate Route Frequency Ordered Stop   04/25/17 0600  piperacillin-tazobactam (ZOSYN) IVPB 3.375 g     3.375 g 12.5 mL/hr over 240 Minutes Intravenous Every 8 hours 04/24/17 2155     04/24/17 1800  piperacillin-tazobactam (ZOSYN) IVPB 3.375 g     3.375 g 100 mL/hr over 30 Minutes Intravenous  Once 04/24/17 1751 04/24/17 1922       Assessment/Plan Osteoporosis - on boniva at home HLD - takes lipitor at home Right hemipelvis structure- 2.5cm, possible small adnexal or ovarian cyst; recommend follow-up with gynecologist   Perforated appendicitis - abdominal surgical history: c section x2 - abdominal pain began 8/24 - CT scan shows abnormal appendix with a large amount of surrounding inflammation, no discrete abscess - no leukocytosis, afebrile  ID - zosyn 8/28>>day#4 FEN - decrease IVF, full liquids VTE - SCDs, lovenox Foley - none Follow up - Dr. Abbey Chattersosenbower  Plan - Advance to full liquids. Continue IV antibiotics. Continue to monitor   LOS: 3 days    [name] , [facility] 04/27/2017, 8:14 AM Pager: [phone] Consults: [phone] Mon-Fri 7:00 am-4:30 pm Sat-Sun 7:00 am-11:30 am

## 2017-04-28 MED ORDER — AMOXICILLIN-POT CLAVULANATE 875-125 MG PO TABS
1.0000 | ORAL_TABLET | Freq: Two times a day (BID) | ORAL | Status: DC
  Administered 2017-04-28: 1 via ORAL
  Filled 2017-04-28: qty 1

## 2017-04-28 MED ORDER — OXYCODONE HCL 5 MG PO TABS: 5.0000 mg | ORAL_TABLET | Freq: Four times a day (QID) | ORAL | 0 refills | Status: AC | PRN

## 2017-04-28 MED ORDER — AMOXICILLIN-POT CLAVULANATE 875-125 MG PO TABS: 1.0000 | ORAL_TABLET | Freq: Two times a day (BID) | ORAL | 1 refills | Status: AC

## 2017-04-28 NOTE — Discharge Planning (Signed)
Patient IV removed. Discharge papers given, explained and educated.  RN assessment and VS revealed stability for DC to home.  Patient informed of suggested FU appt and agreed to call on Tues to set up, since discharging on holiday weekend.  Script e-scripted to pharm and pain meds script signed and given.  Patient tolerated soft diet well. Once ready and ride arrives - patient will be wheeled to front and family transporting home via car. Awaiting on family to arrive about 1600.

## 2017-04-28 NOTE — Progress Notes (Signed)
   Subjective/Chief Complaint: No complaints   Objective: Vital signs in last 24 hours: Temp:  [98 F (36.7 C)-98.5 F (36.9 C)] 98.3 F (36.8 C) (09/01 0533) Pulse Rate:  [48-61] 48 (09/01 0533) Resp:  [15-16] 16 (09/01 0533) BP: (107-116)/(69-84) 111/69 (09/01 0533) SpO2:  [97 %-98 %] 97 % (09/01 0533) Last BM Date: 04/23/17  Intake/Output from previous day: 08/31 0701 - 09/01 0700 In: 1530 [P.O.:480; I.V.:1000; IV Piggyback:50] Out: 2650 [Urine:2650] Intake/Output this shift: No intake/output data recorded.  General appearance: alert and cooperative Resp: clear to auscultation bilaterally Cardio: regular rate and rhythm GI: soft, nontender  Lab Results:   Recent Labs  04/26/17 0500  WBC 3.0*  HGB 12.7  HCT 37.7  PLT 146*   BMET No results for input([name]): NA, K, CL, CO2, GLUCOSE, BUN, CREATININE, CALCIUM in the last 72 hours. PT/INR No results for input([name]): LABPROT, INR in the last 72 hours. ABG No results for input([name]): PHART, HCO3 in the last 72 hours.  Invalid input([name]): PCO2, PO2  Studies/Results: No results found.  Anti-infectives: Anti-infectives    Start     Dose/Rate Route Frequency Ordered Stop   04/28/17 1000  amoxicillin-clavulanate (AUGMENTIN) 875-125 MG per tablet 1 tablet     1 tablet Oral Every 12 hours 04/28/17 0904     04/25/17 0600  piperacillin-tazobactam (ZOSYN) IVPB 3.375 g     3.375 g 12.5 mL/hr over 240 Minutes Intravenous Every 8 hours 04/24/17 2155     04/24/17 1800  piperacillin-tazobactam (ZOSYN) IVPB 3.375 g     3.375 g 100 mL/hr over 30 Minutes Intravenous  Once 04/24/17 1751 04/24/17 1922      Assessment/Plan: [name]/p * No surgery found * Advance diet. Start soft foods Switch to oral augmentin May be ready for d/c later today  LOS: 4 days    Barbara [name],Barbara [name] 04/28/2017

## 2017-04-29 LAB — CULTURE, BLOOD (ROUTINE X 2)
CULTURE: NO GROWTH
CULTURE: NO GROWTH
Special Requests: ADEQUATE

## 2017-05-02 NOTE — Discharge Summary (Signed)
[facility] Discharge Summary   Patient ID: Barbara [name] MRN: [id number] DOB/AGE: 01/08/1956 61 y.o.  Admit date: 04/24/2017 Discharge date: 04/28/2017  Admitting Diagnosis: Perforated appendicitis  Discharge Diagnosis Patient Active Problem List   Diagnosis Date Noted  . Perforated appendicitis 04/24/2017    Consultants None  Imaging: CT abdomen pelvis w contrast 04/24/17: Abnormal appendix with a large amount of surrounding inflammation. Findings are compatible with a perforated appendicitis. No discrete abscess collection. 2.5 cm low-density structure in the right adnexa region. This could represent a small adnexal or ovarian cyst. In a postmenopausal female, recommend a nonemergent pelvic ultrasound for better characterization and follow-up. 0.4 cm nodule at the left lung base. No follow-up needed if patient is low-risk. Non-contrast chest CT can be considered in 12 months if patient is high-risk.   Procedures None  Hospital Course:  Barbara Bushyun Sook Datta is a 61yo [facility] 4 days of abdominal pain. Initially pain was lower in her abdomen, then localized to the RLQ. Workup included CT scan which showed an bnormal appendix with a large amount of surrounding inflammation and no discrete abscess.  She had no leukocytosis. Patient was admitted for bowel rest and IV antibiotics. Pain improved and WBC remained WNL therefore diet was slowly advanced.  Patient was transitioned to oral augmentin. On 9/1 the patient was voiding well, tolerating diet, ambulating well, pain well controlled, vital signs stable and felt stable for discharge home.  Patient will follow up in our office within 2 weeks with Dr. Abbey Chattersosenbower and knows to call with questions or concerns.    Allergies as of 04/28/2017      Reactions   Sulfa Antibiotics Hives      Medication List    TAKE these medications   amoxicillin-clavulanate 875-125 MG tablet Commonly known as:   AUGMENTIN Take 1 tablet by mouth 2 (two) times daily.   atorvastatin 40 MG tablet Commonly known as:  LIPITOR Take 40 mg by mouth daily.   b complex vitamins tablet Take 1 tablet by mouth daily.   calcium carbonate 1250 (500 Ca) MG tablet Commonly known as:  OS-CAL - dosed in mg of elemental calcium Take 1 tablet by mouth daily with breakfast.   cholecalciferol 1000 units tablet Commonly known as:  VITAMIN D Take 2,000 Units by mouth daily.   Fish Oil 1000 MG Caps Take 2,000 mg by mouth daily.   magnesium oxide 400 MG tablet Commonly known as:  MAG-OX Take 400 mg by mouth daily.   oxyCODONE 5 MG immediate release tablet Commonly known as:  Oxy IR/ROXICODONE Take 1 tablet (5 mg total) by mouth every 6 (six) hours as needed for moderate pain or severe pain.   zolpidem 5 MG tablet Commonly known as:  AMBIEN Take 5 mg by mouth at bedtime as needed for sleep.            Discharge Care Instructions        Start     Ordered   04/28/17 0000  amoxicillin-clavulanate (AUGMENTIN) 875-125 MG tablet  2 times daily     04/28/17 0907   04/28/17 0000  oxyCODONE (OXY IR/ROXICODONE) 5 MG immediate release tablet  Every 6 hours PRN     04/28/17 0907   04/28/17 0000  Diet - low sodium heart healthy     04/28/17 0907   04/28/17 0000  Increase activity slowly     04/28/17 [id number]   04/28/17 0000  Discharge instructions  Comments:  May shower. Diet as tolerated.   04/28/17 0907   04/28/17 0000  Call MD for:  temperature >100.4     04/28/17 0907   04/28/17 0000  Call MD for:  persistant nausea and vomiting     04/28/17 0907   04/28/17 0000  Call MD for:  severe uncontrolled pain     04/28/17 0907   04/28/17 0000  Call MD for:  redness, tenderness, or signs of infection (pain, swelling, redness, odor or green/yellow discharge around incision site)     04/28/17 0907   04/28/17 0000  Call MD for:  difficulty breathing, headache or visual disturbances     04/28/17 0907   04/28/17  0000  Call MD for:  hives     04/28/17 0907   04/28/17 0000  Call MD for:  persistant dizziness or light-headedness     04/28/17 0907   04/28/17 0000  Call MD for:  extreme fatigue     04/28/17 0907       Follow-up Information    Avel Peace, MD Follow up in 2 week(s).   Specialty:  General Surgery Contact information: [address] [place] [address] [phone]           Signed: Franne Forts, [facility] 05/02/2017, 4:07 PM Pager: [phone] Consults: [phone] Mon-Fri 7:00 am-4:30 pm Sat-Sun 7:00 am-11:30 am

## 2017-08-31 ENCOUNTER — Other Ambulatory Visit: Payer: Self-pay | Admitting: Obstetrics and Gynecology

## 2017-10-30 ENCOUNTER — Other Ambulatory Visit: Payer: Self-pay

## 2017-12-06 ENCOUNTER — Other Ambulatory Visit: Payer: Self-pay | Admitting: Obstetrics and Gynecology

## 2018-04-23 ENCOUNTER — Telehealth (INDEPENDENT_AMBULATORY_CARE_PROVIDER_SITE_OTHER): Payer: Self-pay

## 2018-04-23 NOTE — Telephone Encounter (Signed)
lvm per Dr. Earlene Plater to f/u with patient to make sure she is returning to [facility] this month to f/u on the ovarian mass on her pelvic sono from April  Please let me know if you already have this scheduled, if not please call and get the sono scheduled .

## 2018-05-06 ENCOUNTER — Other Ambulatory Visit: Payer: Self-pay | Admitting: Obstetrics and Gynecology

## 2018-05-07 ENCOUNTER — Encounter (INDEPENDENT_AMBULATORY_CARE_PROVIDER_SITE_OTHER): Payer: Self-pay | Admitting: Obstetrics and Gynecology

## 2018-05-08 IMAGING — CT CT ABD-PELV W/ CM
2 of 5 series · 15 of 46 positions shown, 17 images · IV contrast (APPLIED)
Comparison: None.

CLINICAL DATA: 61-year-old with right lower quadrant pain. Onset 4
days ago.

EXAM:
CT ABDOMEN AND PELVIS WITH CONTRAST
TECHNIQUE: Multidetector CT imaging of the abdomen and pelvis was performed
using the standard protocol following bolus administration of
intravenous contrast.
CONTRAST:  100mL V0SRE2-GAA IOPAMIDOL (V0SRE2-GAA) INJECTION 61%

[Series 2: axial st · axial · 0.72mm/px · z∈[-413,-13]mm · 12 of 91 slices shown, 14 images]
[im 6/91  soft-tissue]
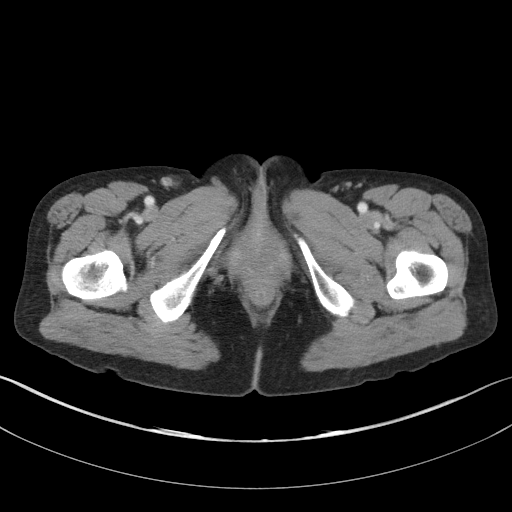
[im 6/91  bone]
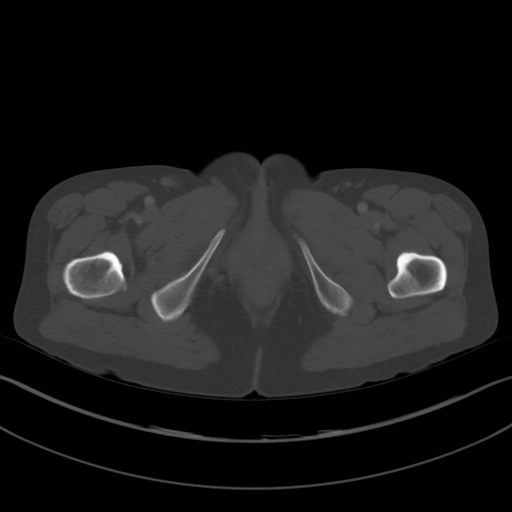
[im 16/91  soft-tissue]
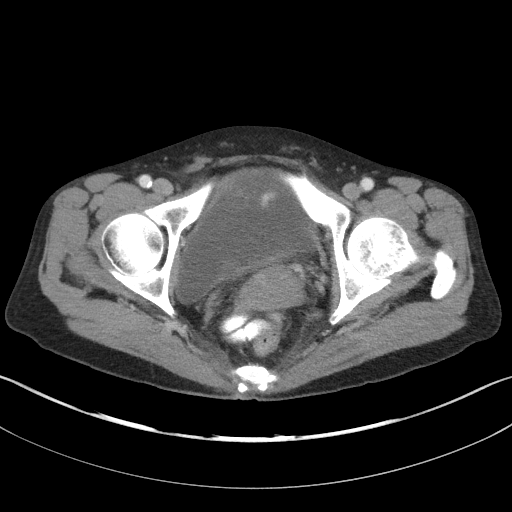
[im 21/91  soft-tissue]
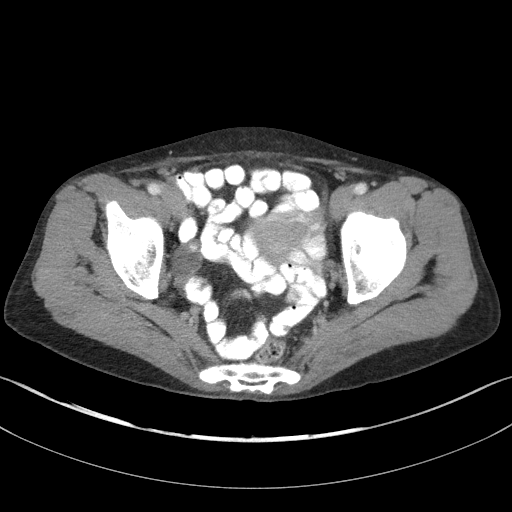
[im 26/91  soft-tissue]
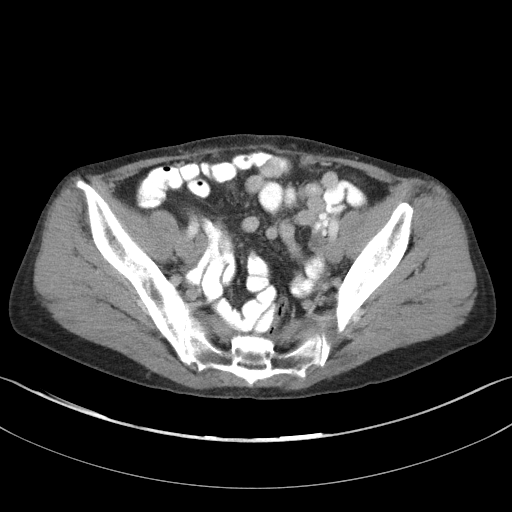
[im 36/91  soft-tissue]
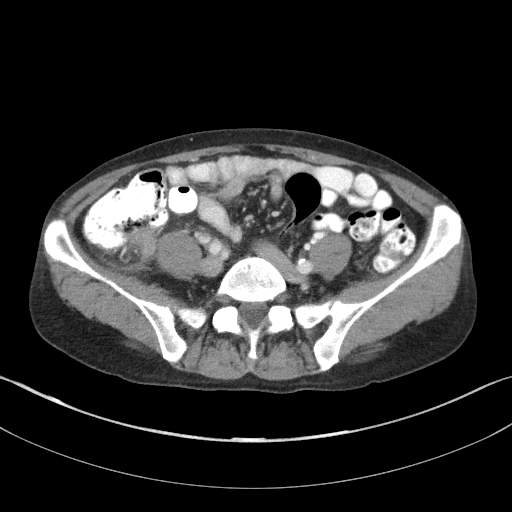
[im 41/91  soft-tissue]
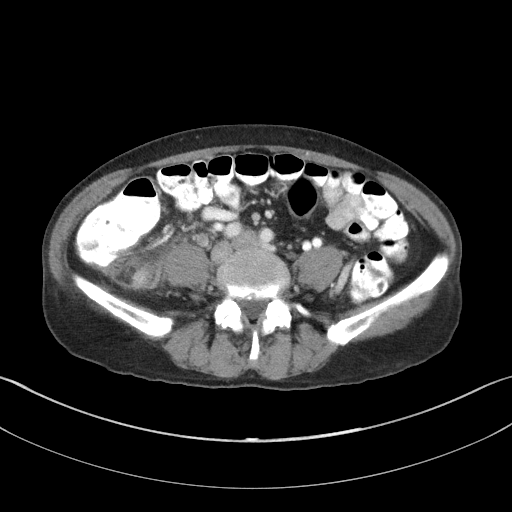
[im 51/91  soft-tissue]
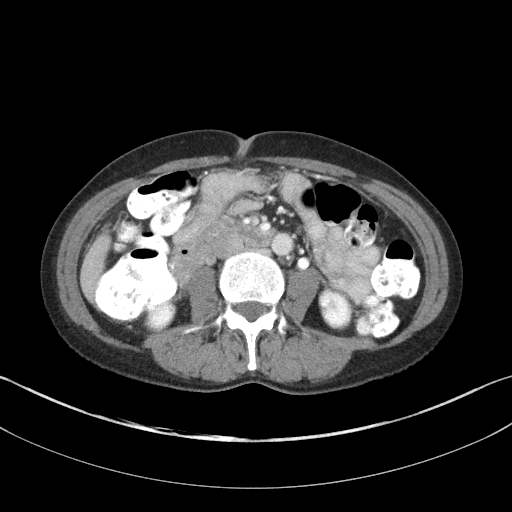
[im 56/91  soft-tissue]
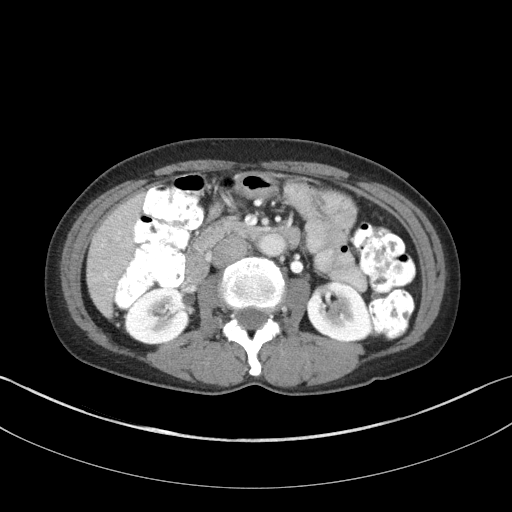
[im 66/91  soft-tissue]
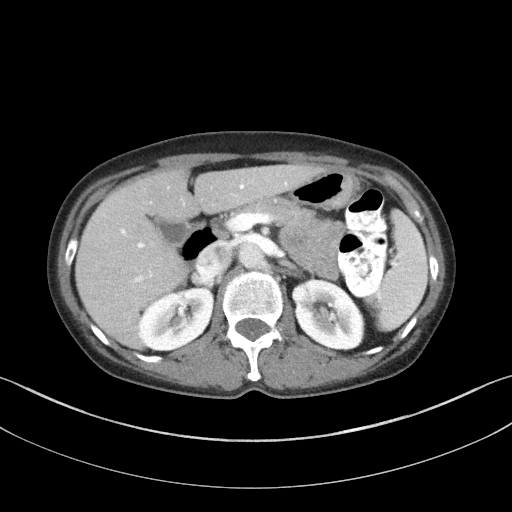
[im 66/91  bone]
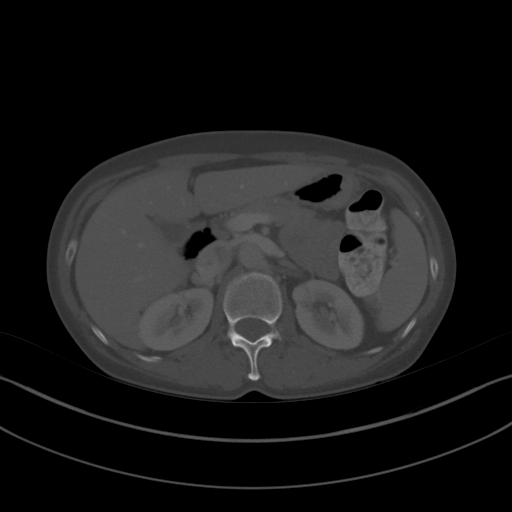
[im 71/91  soft-tissue]
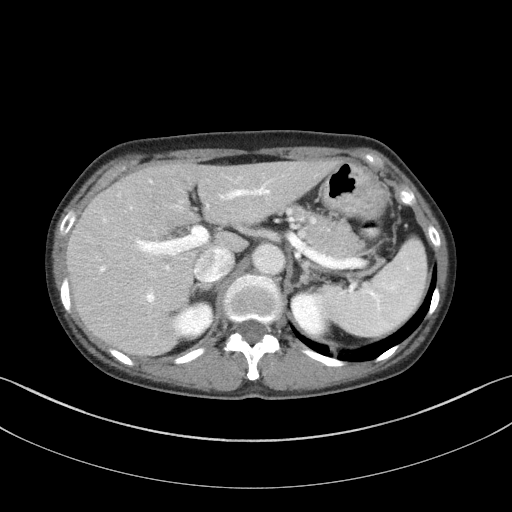
[im 76/91  soft-tissue]
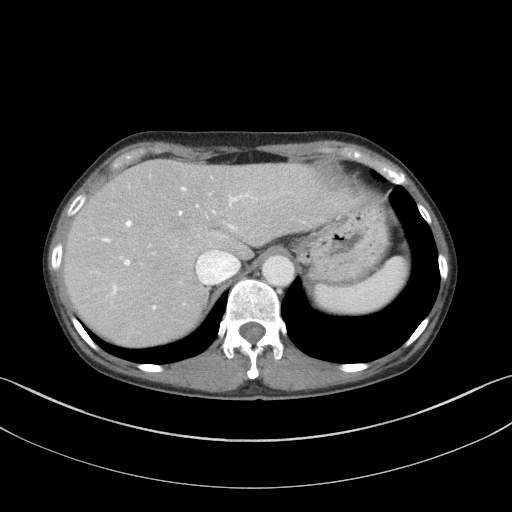
[im 86/91  soft-tissue]
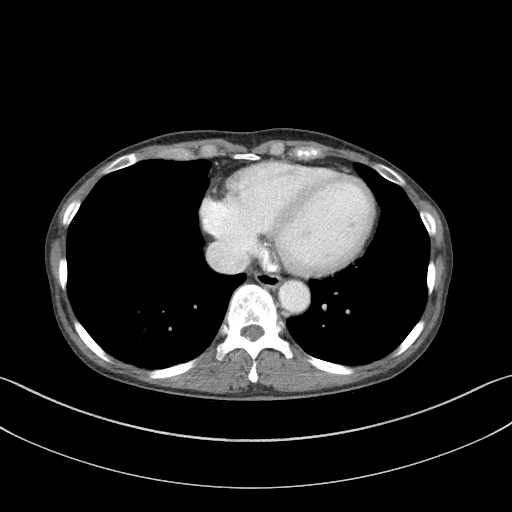

[Series 5: coronal st · coronal · 0.68mm/px · 3 of 65 slices shown]
[im 22/65  soft-tissue]
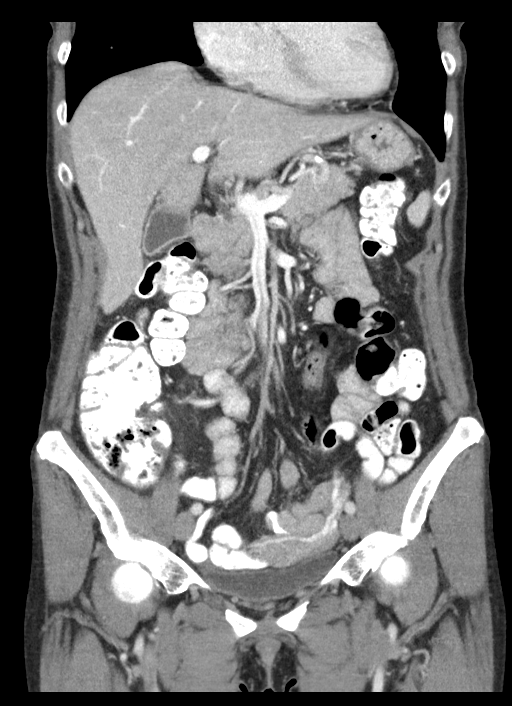
[im 29/65  soft-tissue]
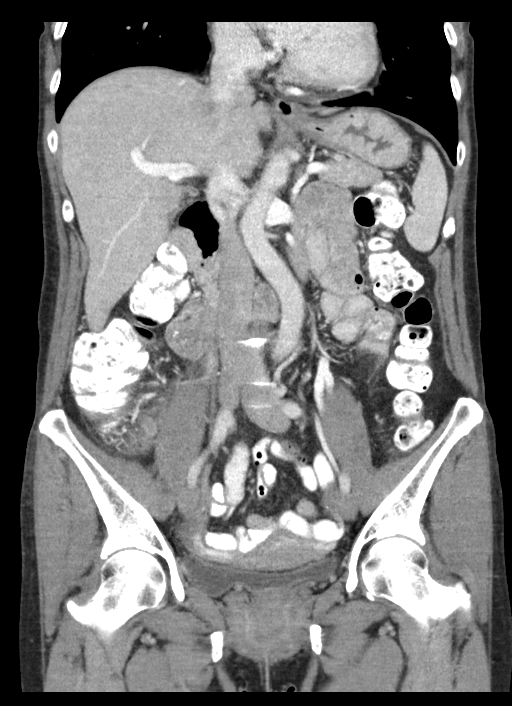
[im 36/65  soft-tissue]
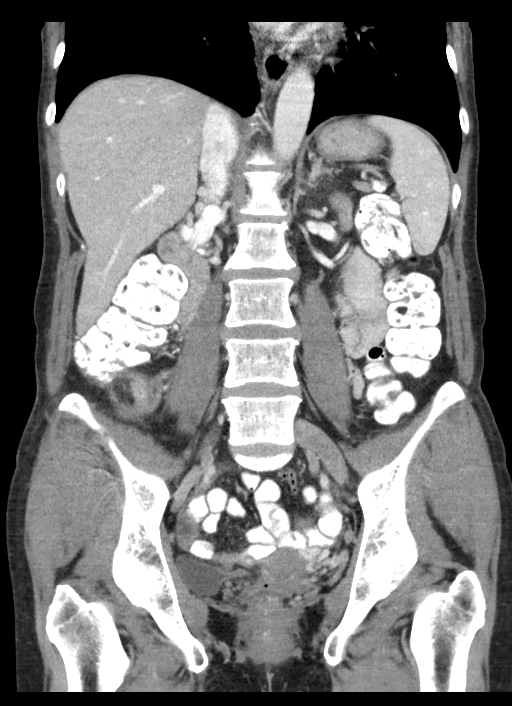

[15 of 46 positions shown; findings below may reference images not displayed]

FINDINGS: Lower chest: 4 mm nodule at the left lung base, best seen on
sequence 5, image 44. No pleural effusions.

Hepatobiliary: Normal appearance of the liver, gallbladder and
portal venous system. No biliary dilatation.

Pancreas: Normal appearance of the pancreas without inflammation or
duct dilatation.

Spleen: Normal appearance of spleen without enlargement.

Adrenals/Urinary Tract: Adrenal glands are normal. Normal appearance
of the left kidney without hydronephrosis. Urinary bladder is
unremarkable. 0.7 cm cyst in the right kidney midpole region. No
evidence for right hydronephrosis.

Stomach/Bowel: The appendix is abnormal with extensive inflammation.
Findings are compatible with acute appendicitis. Small amount of
fluid in the right lower quadrant. No discrete abscess collection.
The appendix is in a retrocecal location. Oral contrast in the small
and large bowel. No evidence for bowel obstruction. Inflammatory
changes in the ileocolic mesentery secondary to the appendix
inflammation. Normal appearance of the stomach and duodenum.

Vascular/Lymphatic: Abdominal aorta is tortuous without aneurysm.
Minimal atherosclerotic disease in the aorta and iliac arteries. No
significant lymph node enlargement in the abdomen or pelvis.

Reproductive: 2.5 cm low-density structure in the right hemipelvis
could represent a small ovarian cyst. Otherwise, no gross
abnormality to the uterus or adnexal structures.

Other: Inflammatory changes in the right lower quadrant with a small
amount of fluid. No discrete abscess collection. No evidence for
free air.

Musculoskeletal: No acute abnormality.
IMPRESSION: Abnormal appendix with a large amount of surrounding inflammation.
Findings are compatible with a perforated appendicitis. No discrete
abscess collection.

2.5 cm low-density structure in the right adnexa region. This could
represent a small adnexal or ovarian cyst. In a postmenopausal
female, recommend a nonemergent pelvic ultrasound for better
characterization and follow-up.

0.4 cm nodule at the left lung base. No follow-up needed if patient
is low-risk. Non-contrast chest CT can be considered in 12 months if
patient is high-risk. This recommendation follows the consensus
statement: Guidelines for Management of Incidental Pulmonary Nodules
Detected on CT Images: From the [HOSPITAL] 0825; Radiology
0825; [DATE].

These results were called by telephone at the time of interpretation
on 04/24/2017 at [DATE] to AUBREY GUSTAVO GAMONTLE , who verbally
acknowledged these results.

## 2019-04-02 ENCOUNTER — Encounter (HOSPITAL_BASED_OUTPATIENT_CLINIC_OR_DEPARTMENT_OTHER): Payer: Self-pay | Admitting: Emergency Medicine

## 2019-04-02 ENCOUNTER — Other Ambulatory Visit: Payer: Self-pay

## 2019-04-02 ENCOUNTER — Emergency Department (HOSPITAL_BASED_OUTPATIENT_CLINIC_OR_DEPARTMENT_OTHER)
Admission: EM | Admit: 2019-04-02 | Discharge: 2019-04-02 | Disposition: A | Discharge status: Home / Self Care | Payer: 59 | Attending: Emergency Medicine | Admitting: Emergency Medicine

## 2019-04-02 DIAGNOSIS — Y999 Unspecified external cause status: Secondary | ICD-10-CM | POA: Diagnosis not present

## 2019-04-02 DIAGNOSIS — H1132 Conjunctival hemorrhage, left eye: Secondary | ICD-10-CM

## 2019-04-02 DIAGNOSIS — S0592XA Unspecified injury of left eye and orbit, initial encounter: Secondary | ICD-10-CM | POA: Diagnosis present

## 2019-04-02 DIAGNOSIS — S0502XA Injury of conjunctiva and corneal abrasion without foreign body, left eye, initial encounter: Secondary | ICD-10-CM

## 2019-04-02 DIAGNOSIS — X58XXXA Exposure to other specified factors, initial encounter: Secondary | ICD-10-CM | POA: Insufficient documentation

## 2019-04-02 DIAGNOSIS — H11422 Conjunctival edema, left eye: Secondary | ICD-10-CM | POA: Diagnosis not present

## 2019-04-02 DIAGNOSIS — Y929 Unspecified place or not applicable: Secondary | ICD-10-CM | POA: Insufficient documentation

## 2019-04-02 DIAGNOSIS — Z79899 Other long term (current) drug therapy: Secondary | ICD-10-CM | POA: Insufficient documentation

## 2019-04-02 DIAGNOSIS — Y939 Activity, unspecified: Secondary | ICD-10-CM | POA: Diagnosis not present

## 2019-04-02 MED ORDER — LORATADINE 10 MG PO TABS
ORAL_TABLET | ORAL | Status: AC
  Filled 2019-04-02: qty 1

## 2019-04-02 MED ORDER — MOXIFLOXACIN HCL 0.5 % OP SOLN: 1.0000 [drp] | Freq: Three times a day (TID) | OPHTHALMIC | 0 refills | Status: AC

## 2019-04-02 MED ORDER — TETRACAINE HCL 0.5 % OP SOLN
OPHTHALMIC | Status: AC
  Filled 2019-04-02: qty 4

## 2019-04-02 MED ORDER — FLUORESCEIN SODIUM 1 MG OP STRP
1.0000 | ORAL_STRIP | Freq: Once | OPHTHALMIC | Status: AC
  Administered 2019-04-02: 03:00:00 1 via OPHTHALMIC

## 2019-04-02 MED ORDER — LORATADINE 10 MG PO TABS
10.0000 mg | ORAL_TABLET | Freq: Once | ORAL | Status: AC
Start: 2019-04-02 — End: 2019-04-02
  Administered 2019-04-02: 10 mg via ORAL

## 2019-04-02 MED ORDER — TETRACAINE HCL 0.5 % OP SOLN
2.0000 [drp] | Freq: Once | OPHTHALMIC | Status: AC
  Administered 2019-04-02: 03:00:00 2 [drp] via OPHTHALMIC

## 2019-04-02 MED ORDER — MOXIFLOXACIN HCL 0.5 % OP SOLN: 1.0000 [drp] | Freq: Three times a day (TID) | OPHTHALMIC | 0 refills | Status: DC

## 2019-04-02 MED ORDER — ACETAMINOPHEN 500 MG PO TABS
ORAL_TABLET | ORAL | Status: AC
  Filled 2019-04-02: qty 2

## 2019-04-02 MED ORDER — ACETAMINOPHEN 500 MG PO TABS
1000.0000 mg | ORAL_TABLET | Freq: Once | ORAL | Status: AC
  Administered 2019-04-02: 03:00:00 1000 mg via ORAL

## 2019-04-02 MED ORDER — FLUORESCEIN SODIUM 1 MG OP STRP
ORAL_STRIP | OPHTHALMIC | Status: AC
  Filled 2019-04-02: qty 1

## 2019-04-02 NOTE — Discharge Instructions (Addendum)
No aspirin or NSAIDs.  Sleep with the head of your bed up.  Do not apply any pressure to the eye.  No contact lenses.  Take Claritin daily.

## 2019-04-02 NOTE — ED Provider Notes (Addendum)
[facility] Provider Note   CSN: [id number] Arrival date & time: 04/02/19  5573     History   Chief Complaint Chief Complaint  Patient presents with   Eye Pain    HPI Barbara [name] is a 63 y.o. female.     The history is provided by the patient.  Eye Problem Location:  Left eye Quality:  Stinging Severity:  Moderate Onset quality:  Sudden Timing:  Constant Progression:  Improving Chronicity:  New Context: not burn, not chemical exposure, not contact lens problem, not direct trauma, not foreign body, not using machinery, not smoke exposure and not UV exposure   Relieved by:  Nothing Worsened by:  Nothing Ineffective treatments:  None tried Associated symptoms: redness and swelling   Associated symptoms: no blurred vision, no crusting, no decreased vision, no discharge, no double vision, no facial rash, no headaches, no itching, no nausea, no numbness, no photophobia, no scotomas, no vomiting and no weakness   Associated symptoms comment:  Chemotic and blood red sclera Risk factors: no exposure to pinkeye, no previous injury to eye, no recent herpes zoster and no recent URI   Patient presents with stinging sensation in left eye while sleeping and woke and sclera of the left eye was blood red and swollen.  No hives.  No trauma.  Is not on aspirin nor blood thinners.  No pets in the home.  No contacts.  No changes in vision.  No halos no headache. Sometimes gets mild redness when outside.  No weakness no numbness no changes in speech.    Past Medical History:  Diagnosis Date   High cholesterol     Patient Active Problem List   Diagnosis Date Noted   Perforated appendicitis 04/24/2017    Past Surgical History:  Procedure Laterality Date   CESAREAN SECTION       OB History   No obstetric history on file.      Home Medications    Prior to Admission medications   Medication Sig Start Date End Date Taking? Authorizing  Provider  ALPRAZolam Duanne Moron) 0.5 MG tablet Take by mouth. 03/12/19  Yes [provider]  traZODone (DESYREL) 50 MG tablet Take by mouth. 09/11/18  Yes [provider]  zolpidem (AMBIEN) 10 MG tablet Take by mouth. 10/16/18  Yes [provider]  amoxicillin-clavulanate (AUGMENTIN) 875-125 MG tablet Take 1 tablet by mouth 2 (two) times daily. 04/28/17   Autumn Messing III, MD  atorvastatin (LIPITOR) 40 MG tablet Take 40 mg by mouth daily.    [provider]  b complex vitamins tablet Take 1 tablet by mouth daily.    [provider]  calcium carbonate (OS-CAL - DOSED IN MG OF ELEMENTAL CALCIUM) 1250 (500 Ca) MG tablet Take 1 tablet by mouth daily with breakfast.    [provider]  cholecalciferol (VITAMIN D) 1000 units tablet Take 2,000 Units by mouth daily.    [provider]  magnesium 30 MG tablet Take by mouth.    [provider]  magnesium oxide (MAG-OX) 400 MG tablet Take 400 mg by mouth daily.    [provider]  moxifloxacin (VIGAMOX) 0.5 % ophthalmic solution Place 1 drop into the left eye 3 (three) times daily. 04/02/19   Elior Robinette, MD  Omega-3 Fatty Acids (FISH OIL) 1000 MG CAPS Take 2,000 mg by mouth daily.    [provider]  oxyCODONE (OXY IR/ROXICODONE) 5 MG immediate release tablet Take 1 tablet (5  mg total) by mouth every 6 (six) hours as needed for moderate pain or severe pain. 04/28/17   Chevis Prettyoth, Paul III, MD  zolpidem (AMBIEN) 5 MG tablet Take 5 mg by mouth at bedtime as needed for sleep.    [provider]    Family History History reviewed. No pertinent family history.  Social History Social History   Tobacco Use   Smoking status: Never Smoker   Smokeless tobacco: Never Used  Substance Use Topics   Alcohol use: Yes    Alcohol/week: 2.0 standard drinks    Types: 2 Glasses of wine per week    Comment: daily   Drug use: No     Allergies   Sulfa antibiotics   Review of  Systems Review of Systems  Constitutional: Negative for fever.  HENT: Negative for congestion and postnasal drip.   Eyes: Positive for pain and redness. Negative for blurred vision, double vision, photophobia, discharge, itching and visual disturbance.  Respiratory: Negative for cough.   Cardiovascular: Negative for chest pain.  Gastrointestinal: Negative for nausea and vomiting.  Skin: Negative for rash.  Neurological: Negative for facial asymmetry, speech difficulty, weakness, numbness and headaches.  Psychiatric/Behavioral: Negative for agitation.  All other systems reviewed and are negative.    Physical Exam Updated Vital Signs BP (!) 163/98 (BP Location: Right Arm)    Pulse 82    Temp 98.4 F (36.9 C) (Oral)    Resp 18    Ht 5\' 5"  (1.651 m)    Wt 55.8 kg    SpO2 99%    BMI 20.47 kg/m   Physical Exam Vitals signs and nursing note reviewed.  Constitutional:      General: She is not in acute distress.    Appearance: She is normal weight.  HENT:     Head: Normocephalic and atraumatic.     Nose: Nose normal.     Mouth/Throat:     Mouth: Mucous membranes are moist.     Pharynx: Oropharynx is clear.  Eyes:     General: Lids are everted, no foreign bodies appreciated.     Intraocular pressure: Left eye pressure is 16 mmHg. Measurements were taken using a handheld tonometer.    Extraocular Movements: Extraocular movements intact.     Left eye: Normal extraocular motion.     Conjunctiva/sclera:     Left eye: Chemosis and hemorrhage present. No exudate.    Pupils: Pupils are equal, round, and reactive to light.      Comments: No proptosis  Neck:     Musculoskeletal: Normal range of motion and neck supple.  Cardiovascular:     Rate and Rhythm: Normal rate and regular rhythm.     Pulses: Normal pulses.     Heart sounds: Normal heart sounds.  Pulmonary:     Effort: Pulmonary effort is normal.     Breath sounds: Normal breath sounds.  Abdominal:     General: Abdomen is flat.  Bowel sounds are normal.     Tenderness: There is no abdominal tenderness. There is no guarding or rebound.  Musculoskeletal: Normal range of motion.  Skin:    General: Skin is warm and dry.     Capillary Refill: Capillary refill takes less than 2 seconds.     Findings: No rash.  Neurological:     General: No focal deficit present.     Mental Status: She is alert and oriented to person, place, and time.  Psychiatric:        Mood  and Affect: Mood normal.        Behavior: Behavior normal.      ED Treatments / Results  Labs (all labs ordered are listed, but only abnormal results are displayed) Labs Reviewed - No data to display  EKG None  Radiology No results found.  Procedures Procedures (including critical care time)  Medications Ordered in ED Medications  loratadine (CLARITIN) tablet 10 mg (10 mg Oral Given 04/02/19 0326)  fluorescein ophthalmic strip 1 strip (1 strip Left Eye Given 04/02/19 0328)  tetracaine (PONTOCAINE) 0.5 % ophthalmic solution 2 drop (2 drops Right Eye Given 04/02/19 0328)  acetaminophen (TYLENOL) tablet 1,000 mg (1,000 mg Oral Given 04/02/19 0326)       Visual Acuity  Right Eye Distance: 20/40 Left Eye Distance: 20/50 Bilateral Distance: 20/40  Right Eye Near:   Left Eye Near:    Bilateral Near:  20/40    Subconjunctival hemorrhage. Not on anticoagulation.  No known vascular disease. No known trauma. Eye pressure is normal at this time. I do not believe this is glaucoma as the pressure is normal and the pupils is not mid dilated and this happened in the dark and she denies visual changes. I think this is a problem of the globe itself. I do not suspect retrobulbar bleeding. I suspect given the linear abrasion patient scratched or rubbed the eye but she cannot recall this. It is unclear if rubbing it caused this or the hemorrhage was spontaneous and the stinging sensation made her scratch the eye. Sleep with the head of the bed up.  No contacts.  No not  apply pressure to the eye.  Do not use aspirin or NSAIDs.  Use eye drops and take claritin.  Call first thing this am to be seen in follow up by ophthalmology.   Final Clinical Impressions(s) / ED Diagnoses   Final diagnoses:  Subconjunctival hematoma, left  Chemosis of left conjunctiva  Abrasion of left cornea, initial encounter   Return for intractable cough, coughing up blood,fevers >100.4 unrelieved by medication, shortness of breath, intractable vomiting, chest pain, shortness of breath, weakness,numbness, changes in speech, facial asymmetry,abdominal pain, passing out,Inability to tolerate liquids or food, cough, altered mental status or any concerns. No signs of systemic illness or infection. The patient is nontoxic-appearing on exam and vital signs are within normal limits.   I have reviewed the triage vital signs and the nursing notes. Pertinent labs &imaging results that were available during my care of the patient were reviewed by me and considered in my medical decision making (see chart for details).  After history, exam, and medical workup I feel the patient has been appropriately medically screened and is safe for discharge home. Pertinent diagnoses were discussed with the patient. Patient was given return precautions  ED Discharge Orders         Ordered    moxifloxacin (VIGAMOX) 0.5 % ophthalmic solution  3 times daily,   Status:  Discontinued     04/02/19 0314    moxifloxacin (VIGAMOX) 0.5 % ophthalmic solution  3 times daily     04/02/19 0328           Zahlia Deshazer, MD 04/02/19 [id number]    Nicanor AlconPalumbo, Sheniah Supak, MD 04/02/19 [id number]    Nicanor AlconPalumbo, Linsie Lupo, MD 04/02/19 [id number]

## 2019-04-02 NOTE — ED Triage Notes (Signed)
Awoke at with left eye stinging 30 minutes ago. Bulging area and red left sclera.

## 2019-12-08 ENCOUNTER — Other Ambulatory Visit: Payer: Self-pay

## 2020-12-31 ENCOUNTER — Other Ambulatory Visit: Payer: Self-pay

## 2022-01-30 ENCOUNTER — Other Ambulatory Visit: Payer: Self-pay

## 2023-08-15 ENCOUNTER — Other Ambulatory Visit: Payer: Self-pay

## 2024-08-04 ENCOUNTER — Other Ambulatory Visit: Payer: Self-pay

## 2024-08-04 DIAGNOSIS — Z13820 Encounter for screening for osteoporosis: Secondary | ICD-10-CM

## 2024-08-04 DIAGNOSIS — Z1231 Encounter for screening mammogram for malignant neoplasm of breast: Secondary | ICD-10-CM

## 2024-08-25 ENCOUNTER — Ambulatory Visit
Admission: RE | Admit: 2024-08-25 | Discharge: 2024-08-25 | Disposition: A | Discharge status: Home / Self Care | Source: Ambulatory Visit

## 2024-08-25 DIAGNOSIS — Z13820 Encounter for screening for osteoporosis: Secondary | ICD-10-CM | POA: Insufficient documentation

## 2024-08-25 DIAGNOSIS — Z1231 Encounter for screening mammogram for malignant neoplasm of breast: Secondary | ICD-10-CM | POA: Insufficient documentation

## 2024-09-18 ENCOUNTER — Other Ambulatory Visit: Payer: Self-pay

## 2024-09-18 ENCOUNTER — Ambulatory Visit
Admission: RE | Admit: 2024-09-18 | Discharge: 2024-09-18 | Disposition: A | Discharge status: Home / Self Care | Source: Ambulatory Visit
